# Patient Record
Sex: Male | Born: 1960
Health system: Southern US, Community
[De-identification: ages and names within clinical notes are randomized; demographics above are authoritative.]

## PROBLEM LIST (undated history)

## (undated) DIAGNOSIS — F419 Anxiety disorder, unspecified: Secondary | ICD-10-CM

## (undated) HISTORY — DX: Anxiety disorder, unspecified: F41.9

## (undated) HISTORY — PX: HERNIA REPAIR: SHX51

## (undated) HISTORY — PX: COLON SURGERY: SHX602

---

## 2004-06-04 ENCOUNTER — Encounter: Admission: RE | Admit: 2004-06-04 | Discharge: 2004-06-16 | Payer: Self-pay | Admitting: Surgery

## 2004-06-11 ENCOUNTER — Inpatient Hospital Stay (HOSPITAL_COMMUNITY): Admission: RE | Admit: 2004-06-11 | Discharge: 2004-06-16 | Payer: Self-pay | Admitting: Surgery

## 2005-01-18 ENCOUNTER — Ambulatory Visit (HOSPITAL_COMMUNITY): Admission: RE | Admit: 2005-01-18 | Discharge: 2005-01-19 | Payer: Self-pay | Admitting: Surgery

## 2005-09-24 ENCOUNTER — Ambulatory Visit (HOSPITAL_COMMUNITY): Admission: RE | Admit: 2005-09-24 | Discharge: 2005-09-24 | Payer: Self-pay | Admitting: Urology

## 2005-09-24 ENCOUNTER — Ambulatory Visit (HOSPITAL_BASED_OUTPATIENT_CLINIC_OR_DEPARTMENT_OTHER): Admission: RE | Admit: 2005-09-24 | Discharge: 2005-09-24 | Payer: Self-pay | Admitting: Urology

## 2005-09-26 ENCOUNTER — Ambulatory Visit (HOSPITAL_COMMUNITY): Admission: RE | Admit: 2005-09-26 | Discharge: 2005-09-26 | Payer: Self-pay | Admitting: Urology

## 2005-10-10 ENCOUNTER — Ambulatory Visit (HOSPITAL_COMMUNITY): Admission: RE | Admit: 2005-10-10 | Discharge: 2005-10-10 | Payer: Self-pay | Admitting: Urology

## 2005-10-31 ENCOUNTER — Ambulatory Visit (HOSPITAL_COMMUNITY): Admission: RE | Admit: 2005-10-31 | Discharge: 2005-10-31 | Payer: Self-pay | Admitting: Urology

## 2006-09-12 IMAGING — CR DG ABDOMEN 1V
1 series · 1 of 1 positions shown · non-contrast
Comparison: 09/26/05.

CLINICAL DATA: Patient has a history of left ureteral calculus.
 ABDOMEN ? 1 VIEW:

[view not recorded]
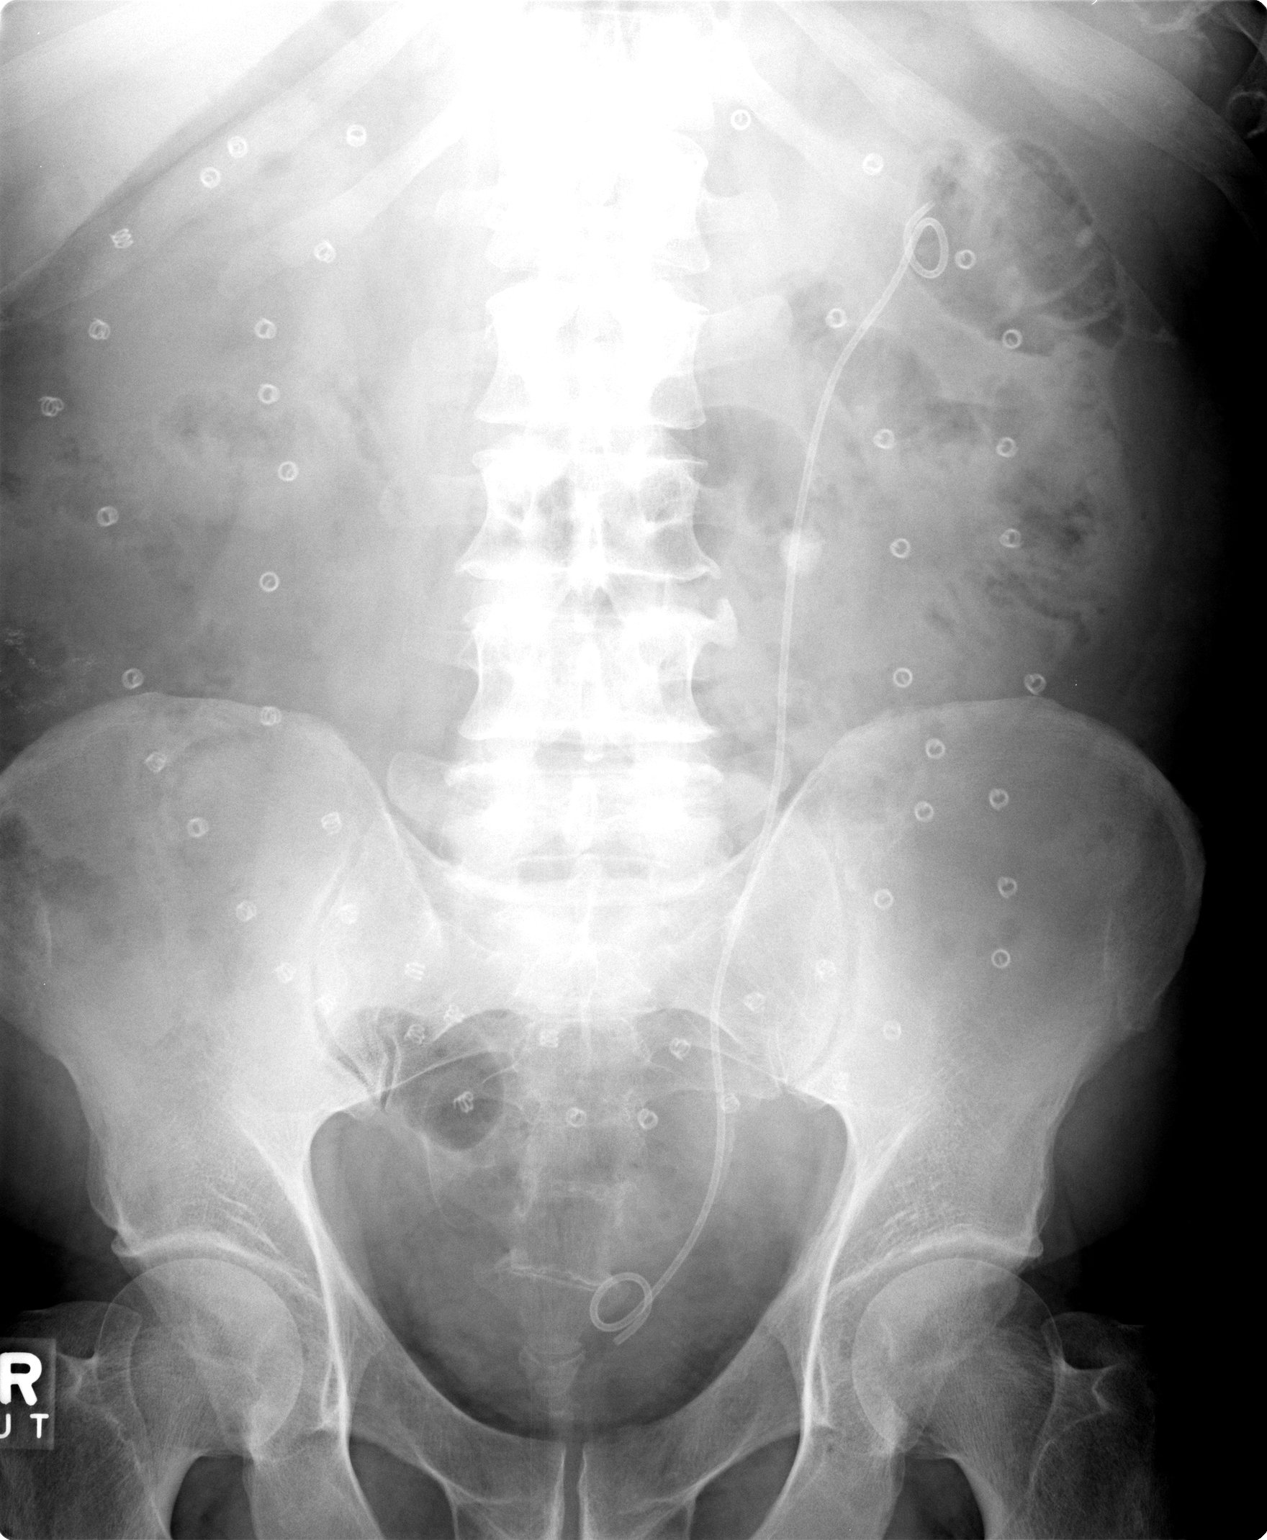

[1 of 1 positions shown; findings below may reference images not displayed]

FINDINGS: The left double J catheter is in position.  A 13 x 12 mm calculus is again noted in the left mid ureter.
IMPRESSION: No change in the position of the left mid ureteral calculus.

## 2012-09-15 ENCOUNTER — Ambulatory Visit: Payer: Self-pay | Admitting: Otolaryngology

## 2012-10-01 ENCOUNTER — Ambulatory Visit: Payer: Self-pay | Admitting: Anesthesiology

## 2012-10-08 ENCOUNTER — Ambulatory Visit: Payer: Self-pay | Admitting: Otolaryngology

## 2012-10-15 LAB — PATHOLOGY REPORT

## 2012-12-15 LAB — WOUND CULTURE

## 2013-09-24 ENCOUNTER — Ambulatory Visit: Payer: Self-pay

## 2014-03-15 ENCOUNTER — Encounter: Payer: Self-pay | Admitting: Internal Medicine

## 2014-03-15 ENCOUNTER — Ambulatory Visit: Payer: No Typology Code available for payment source | Attending: Internal Medicine | Admitting: Internal Medicine

## 2014-03-15 VITALS — BP 132/78 | HR 73 | Temp 98.2°F | Resp 16 | Ht 74.0 in | Wt 221.0 lb

## 2014-03-15 DIAGNOSIS — M25579 Pain in unspecified ankle and joints of unspecified foot: Secondary | ICD-10-CM

## 2014-03-15 DIAGNOSIS — Z7689 Persons encountering health services in other specified circumstances: Secondary | ICD-10-CM

## 2014-03-15 DIAGNOSIS — M79609 Pain in unspecified limb: Secondary | ICD-10-CM | POA: Insufficient documentation

## 2014-03-15 DIAGNOSIS — Z880 Allergy status to penicillin: Secondary | ICD-10-CM | POA: Insufficient documentation

## 2014-03-15 DIAGNOSIS — F17200 Nicotine dependence, unspecified, uncomplicated: Secondary | ICD-10-CM | POA: Insufficient documentation

## 2014-03-15 DIAGNOSIS — M62838 Other muscle spasm: Secondary | ICD-10-CM | POA: Insufficient documentation

## 2014-03-15 DIAGNOSIS — M549 Dorsalgia, unspecified: Secondary | ICD-10-CM | POA: Insufficient documentation

## 2014-03-15 DIAGNOSIS — Z7189 Other specified counseling: Secondary | ICD-10-CM

## 2014-03-15 MED ORDER — CYCLOBENZAPRINE HCL 10 MG PO TABS: 10.0000 mg | ORAL_TABLET | Freq: Two times a day (BID) | ORAL | Status: DC | PRN
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
## 2014-03-15 NOTE — Progress Notes (Signed)
Pt is here to establish care. Pt reports having chronic lower back, shoulders and knee pain. Pt states that his ankles are swelling up.

## 2014-03-15 NOTE — Patient Instructions (Addendum)
Osteoarthritis Osteoarthritis is a disease that causes soreness and swelling (inflammation) of a joint. It occurs when the cartilage at the affected joint wears down. Cartilage acts as a cushion, covering the ends of bones where they meet to form a joint. Osteoarthritis is the most common form of arthritis. It often occurs in older people. The joints affected most often by this condition include those in the:  Ends of the fingers.  Thumbs.  Neck.  Lower back.  Knees.  Hips. CAUSES  Over time, the cartilage that covers the ends of bones begins to wear away. This causes bone to rub on bone, producing pain and stiffness in the affected joints.  RISK FACTORS Certain factors can increase your chances of having osteoarthritis, including:  Older age.  Excessive body weight.  Overuse of joints. SIGNS AND SYMPTOMS   Pain, swelling, and stiffness in the joint.  Over time, the joint may lose its normal shape.  Small deposits of bone (osteophytes) may grow on the edges of the joint.  Bits of bone or cartilage can break off and float inside the joint space. This may cause more pain and damage. DIAGNOSIS  Your health care provider will do a physical exam and ask about your symptoms. Various tests may be ordered, such as:  X-rays of the affected joint.  An MRI scan.  Blood tests to rule out other types of arthritis.  Joint fluid tests. This involves using a needle to draw fluid from the joint and examining the fluid under a microscope. TREATMENT  Goals of treatment are to control pain and improve joint function. Treatment plans may include:  A prescribed exercise program that allows for rest and joint relief.  A weight control plan.  Pain relief techniques, such as:  Properly applied heat and cold.  Electric pulses delivered to nerve endings under the skin (transcutaneous electrical nerve stimulation, TENS).  Massage.  Certain nutritional supplements.  Medicines to  control pain, such as:  Acetaminophen.  Nonsteroidal anti-inflammatory drugs (NSAIDs), such as naproxen.  Narcotic or central-acting agents, such as tramadol.  Corticosteroids. These can be given orally or as an injection.  Surgery to reposition the bones and relieve pain (osteotomy) or to remove loose pieces of bone and cartilage. Joint replacement may be needed in advanced states of osteoarthritis. HOME CARE INSTRUCTIONS   Only take over-the-counter or prescription medicines as directed by your health care provider. Take all medicines exactly as instructed.  Maintain a healthy weight. Follow your health care provider's instructions for weight control. This may include dietary instructions.  Exercise as directed. Your health care provider can recommend specific types of exercise. These may include:  Strengthening exercises These are done to strengthen the muscles that support joints affected by arthritis. They can be performed with weights or with exercise bands to add resistance.  Aerobic activities These are exercises, such as brisk walking or low-impact aerobics, that get your heart pumping.  Range-of-motion activities These keep your joints limber.  Balance and agility exercises These help you maintain daily living skills.  Rest your affected joints as directed by your health care provider.  Follow up with your health care provider as directed. SEEK MEDICAL CARE IF:   Your skin turns red.  You develop a rash in addition to your joint pain.  You have worsening joint pain. SEEK IMMEDIATE MEDICAL CARE IF:  You have a significant loss of weight or appetite.  You have a fever along with joint or muscle aches.  You have   night sweats. FOR MORE INFORMATION  National Institute of Arthritis and Musculoskeletal and Skin Diseases: www.niams.http://www.myers.net/nih.gov [employer] Aging: https://walker.com/www.nia.nih.gov American College of Rheumatology: www.rheumatology.org Document Released: 10/07/2005  Document Revised: 07/28/2013 Document Reviewed: 06/14/2013 [facility] Patient Information 2014 [place], [place].  Back Pain, Adult Low back pain is very common. About 1 in 5 people have back pain.The cause of low back pain is rarely dangerous. The pain often gets better over time.About half of people with a sudden onset of back pain feel better in just 2 weeks. About 8 in 10 people feel better by 6 weeks.  CAUSES Some common causes of back pain include:  Strain of the muscles or ligaments supporting the spine.  Wear and tear (degeneration) of the spinal discs.  Arthritis.  Direct injury to the back. DIAGNOSIS Most of the time, the direct cause of low back pain is not known.However, back pain can be treated effectively even when the exact cause of the pain is unknown.Answering your caregiver's questions about your overall health and symptoms is one of the most accurate ways to make sure the cause of your pain is not dangerous. If your caregiver needs more information, he or she may order lab work or imaging tests (X-rays or MRIs).However, even if imaging tests show changes in your back, this usually does not require surgery. HOME CARE INSTRUCTIONS For many people, back pain returns.Since low back pain is rarely dangerous, it is often a condition that people can learn to [facility] LLCmanageon their own.   Remain active. It is stressful on the back to sit or stand in one place. Do not sit, drive, or stand in one place for more than 30 minutes at a time. Take short walks on level surfaces as soon as pain allows.Try to increase the length of time you walk each day.  Do not stay in bed.Resting more than 1 or 2 days can delay your recovery.  Do not avoid exercise or work.Your body is made to move.It is not dangerous to be active, even though your back may hurt.Your back will likely heal faster if you return to being active before your pain is gone.  Pay attention to your body when you bend and lift.  Many people have less discomfortwhen lifting if they bend their knees, keep the load close to their bodies,and avoid twisting. Often, the most comfortable positions are those that put less stress on your recovering back.  Find a comfortable position to sleep. Use a firm mattress and lie on your side with your knees slightly bent. If you lie on your back, put a pillow under your knees.  Only take over-the-counter or prescription medicines as directed by your caregiver. Over-the-counter medicines to reduce pain and inflammation are often the most helpful.Your caregiver may prescribe muscle relaxant drugs.These medicines help dull your pain so you can more quickly return to your normal activities and healthy exercise.  Put ice on the injured area.  Put ice in a plastic bag.  Place a towel between your skin and the bag.  Leave the ice on for 15-20 minutes, 03-04 times a day for the first 2 to 3 days. After that, ice and heat may be alternated to reduce pain and spasms.  Ask your caregiver about trying back exercises and gentle massage. This may be of some benefit.  Avoid feeling anxious or stressed.Stress increases muscle tension and can worsen back pain.It is important to recognize when you are anxious or stressed and learn ways to manage it.Exercise is  a great option. SEEK MEDICAL CARE IF:  You have pain that is not relieved with rest or medicine.  You have pain that does not improve in 1 week.  You have new symptoms.  You are generally not feeling well. SEEK IMMEDIATE MEDICAL CARE IF:   You have pain that radiates from your back into your legs.  You develop new bowel or bladder control problems.  You have unusual weakness or numbness in your arms or legs.  You develop nausea or vomiting.  You develop abdominal pain.  You feel faint. Document Released: 10/07/2005 Document Revised: 04/07/2012 Document Reviewed: 02/25/2011 [facility] Patient Information 2014 [place],  [place]. DASH Diet The DASH diet stands for "Dietary Approaches to Stop Hypertension." It is a healthy eating plan that has been shown to reduce high blood pressure (hypertension) in as little as 14 days, while also possibly providing other significant health benefits. These other health benefits include reducing the risk of breast cancer after menopause and reducing the risk of type 2 diabetes, heart disease, colon cancer, and stroke. Health benefits also include weight loss and slowing kidney failure in patients with chronic kidney disease.  DIET GUIDELINES  Limit salt (sodium). Your diet should contain less than 1500 mg of sodium daily.  Limit refined or processed carbohydrates. Your diet should include mostly whole grains. Desserts and added sugars should be used sparingly.  Include small amounts of heart-healthy fats. These types of fats include nuts, oils, and tub margarine. Limit saturated and trans fats. These fats have been shown to be harmful in the body. CHOOSING FOODS  The following food groups are based on a 2000 calorie diet. See your Registered Dietitian for individual calorie needs. Grains and Grain Products (6 to 8 servings daily)  Eat More Often: Whole-wheat bread, brown rice, whole-grain or wheat pasta, quinoa, popcorn without added fat or salt (air popped).  Eat Less Often: White bread, white pasta, white rice, cornbread. Vegetables (4 to 5 servings daily)  Eat More Often: Fresh, frozen, and canned vegetables. Vegetables may be raw, steamed, roasted, or grilled with a minimal amount of fat.  Eat Less Often/Avoid: Creamed or fried vegetables. Vegetables in a cheese sauce. Fruit (4 to 5 servings daily)  Eat More Often: All fresh, canned (in natural juice), or frozen fruits. Dried fruits without added sugar. One hundred percent fruit juice ( cup [237 mL] daily).  Eat Less Often: Dried fruits with added sugar. Canned fruit in light or heavy syrup. [employer], Fish, and Poultry  (2 servings or less daily. One serving is 3 to 4 oz [85-114 g]).  Eat More Often: Ninety percent or leaner ground beef, tenderloin, sirloin. Round cuts of beef, chicken breast, [place] breast. All fish. Grill, bake, or broil your meat. Nothing should be fried.  Eat Less Often/Avoid: Fatty cuts of meat, [place], or chicken leg, thigh, or wing. Fried cuts of meat or fish. Dairy (2 to 3 servings)  Eat More Often: Low-fat or fat-free milk, low-fat plain or light yogurt, reduced-fat or part-skim cheese.  Eat Less Often/Avoid: Milk (whole, 2%).Whole milk yogurt. Full-fat cheeses. Nuts, Seeds, and Legumes (4 to 5 servings per week)  Eat More Often: All without added salt.  Eat Less Often/Avoid: Salted nuts and seeds, canned beans with added salt. Fats and Sweets (limited)  Eat More Often: Vegetable oils, tub margarines without trans fats, sugar-free gelatin. Mayonnaise and salad dressings.  Eat Less Often/Avoid: Coconut oils, palm oils, butter, stick margarine, cream, half and half, cookies, candy, pie.  FOR MORE INFORMATION The Dash Diet Eating Plan: www.dashdiet.org Document Released: 09/26/2011 Document Revised: 12/30/2011 Document Reviewed: 09/26/2011 [facility] Patient Information 2014 [place], [place].

## 2014-03-22 ENCOUNTER — Ambulatory Visit: Payer: No Typology Code available for payment source | Attending: Internal Medicine

## 2014-03-22 ENCOUNTER — Ambulatory Visit (HOSPITAL_COMMUNITY)
Admission: RE | Admit: 2014-03-22 | Discharge: 2014-03-22 | Disposition: A | Discharge status: Home / Self Care | Payer: No Typology Code available for payment source | Source: Ambulatory Visit | Attending: Internal Medicine | Admitting: Internal Medicine

## 2014-03-22 DIAGNOSIS — M549 Dorsalgia, unspecified: Secondary | ICD-10-CM

## 2014-03-22 DIAGNOSIS — I700 Atherosclerosis of aorta: Secondary | ICD-10-CM | POA: Insufficient documentation

## 2014-03-22 DIAGNOSIS — M47817 Spondylosis without myelopathy or radiculopathy, lumbosacral region: Secondary | ICD-10-CM | POA: Insufficient documentation

## 2014-03-22 DIAGNOSIS — M25579 Pain in unspecified ankle and joints of unspecified foot: Secondary | ICD-10-CM

## 2014-03-22 DIAGNOSIS — Z7689 Persons encountering health services in other specified circumstances: Secondary | ICD-10-CM

## 2014-03-22 LAB — COMPLETE METABOLIC PANEL WITH GFR
ALT: 20 U/L (ref 0–53)
AST: 19 U/L (ref 0–37)
Albumin: 4.1 g/dL (ref 3.5–5.2)
Alkaline Phosphatase: 78 U/L (ref 39–117)
BUN: 13 mg/dL (ref 6–23)
CO2: 28 mEq/L (ref 19–32)
Calcium: 9.1 mg/dL (ref 8.4–10.5)
Chloride: 103 mEq/L (ref 96–112)
Creat: 0.79 mg/dL (ref 0.50–1.35)
GFR, Est African American: >89 mL/min
GFR, Est Non African American: >89 mL/min
GLUCOSE: 102 mg/dL — AB (ref 70–99)
Potassium: 4.5 mEq/L (ref 3.5–5.3)
Sodium: 138 mEq/L (ref 135–145)
Total Bilirubin: 0.6 mg/dL (ref 0.2–1.2)
Total Protein: 6.9 g/dL (ref 6.0–8.3)

## 2014-03-22 LAB — CBC
HCT: 45 % (ref 39.0–52.0)
Hemoglobin: 15.9 g/dL (ref 13.0–17.0)
MCH: 31.9 pg (ref 26.0–34.0)
MCHC: 35.3 g/dL (ref 30.0–36.0)
MCV: 90.2 fL (ref 78.0–100.0)
PLATELETS: 227 10*3/uL (ref 150–400)
RBC: 4.99 MIL/uL (ref 4.22–5.81)
RDW: 13.2 % (ref 11.5–15.5)
WBC: 6.1 10*3/uL (ref 4.0–10.5)

## 2014-03-22 LAB — HEMOGLOBIN A1C
Hgb A1c MFr Bld: 5.6 % (ref <5.7)
Mean Plasma Glucose: 114 mg/dL (ref <117)

## 2014-03-22 LAB — LIPID PANEL
Cholesterol: 200 mg/dL (ref 0–200)
HDL: 48 mg/dL (ref >39)
LDL Cholesterol: 100 mg/dL — ABNORMAL HIGH (ref 0–99)
Total CHOL/HDL Ratio: 4.2 Ratio
Triglycerides: 262 mg/dL — ABNORMAL HIGH (ref <150)
VLDL: 52 mg/dL — ABNORMAL HIGH (ref 0–40)

## 2014-03-23 ENCOUNTER — Telehealth: Payer: Self-pay | Admitting: Emergency Medicine

## 2014-03-23 ENCOUNTER — Other Ambulatory Visit: Payer: Self-pay | Admitting: Internal Medicine

## 2014-03-23 LAB — TSH: TSH: 1.705 u[IU]/mL (ref 0.350–4.500)

## 2014-03-23 MED ORDER — OMEGA-3-ACID ETHYL ESTERS 1 G PO CAPS: 2.0000 g | ORAL_CAPSULE | Freq: Two times a day (BID) | ORAL | Status: AC

## 2014-03-23 NOTE — Telephone Encounter (Signed)
Message copied by Darlis Loan on Wed Mar 23, 2014  4:17 PM ------      Message from: [place] A      Created: Wed Mar 23, 2014  1:56 PM       Patient labs look good except triglycerides. Sent patient omega 3 to take 2g BID to help lower triglycerides. Please educate patient on diet and lifestyle modifications. Thanks. Will repeat labs in 6 months-please place order. ------

## 2014-03-23 NOTE — Telephone Encounter (Signed)
Pt given xray results. Instructed to continue taking prescribed Flexeril for pain and perform back exercises. Pt verbalized understanding

## 2014-03-23 NOTE — Telephone Encounter (Signed)
Pt given lab results with education on lowering cholesterol levels.pt verbalized understanding

## 2014-03-23 NOTE — Telephone Encounter (Signed)
Message copied by Darlis Loan on Wed Mar 23, 2014  4:06 PM ------      Message from: Joshua Love      Created: Tue Mar 22, 2014  6:06 PM       Let patient know his xray of spine revealed osteoarthritis.  He needs to perform back exercises-ROM. He can continue flexeril as well. Thanks ------

## 2014-03-30 ENCOUNTER — Other Ambulatory Visit: Payer: Self-pay | Admitting: Internal Medicine

## 2014-04-28 ENCOUNTER — Other Ambulatory Visit: Payer: Self-pay | Admitting: Internal Medicine

## 2014-05-06 ENCOUNTER — Other Ambulatory Visit: Payer: Self-pay | Admitting: Internal Medicine

## 2015-02-10 NOTE — Op Note (Signed)
PATIENT NAME:  Joshua Love, Jalan E MR#:  [id number] DATE OF BIRTH:  1960-11-06  DATE OF PROCEDURE:  10/08/2012  DATE OF DICTATION: 10/08/2012   SURGEON: Zackery BarefootJ. Madison Tamella Tuccillo, MD  PREOPERATIVE DIAGNOSES:  1. Chronic sinusitis (ethmoid and maxillary).  2. Nasal obstruction secondary to septal deformity and bilateral inferior turbinate hypertrophy.  3. 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   PROCEDURES:  1. Bilateral anterior and posterior ethmoidectomy with tissue removal.  2. Bilateral maxillary antrostomies with tissue removal.  3. Septoplasty.  4. Bilateral submucous resection of the inferior turbinates.  5. Excision of right ear lesion.   DESCRIPTION OF THE PROCEDURE: The patient was identified in the holding area and was brought back to the operating room in the supine position on the operating room table. After general endotracheal anesthesia had been induced the patient was turned 90 degrees counter clockwise from anesthesia. The nose was anesthetized with infraorbital nerve blocks and septal injection with 0.5% Lidocaine and 0.25% Bupivacaine mixed with 1:150,000 with Epinephrine and phenylephrine Lidocaine soaked pledgets, two on each side were placed and the face was prepped and draped in the usual fashion. The pledgets were removed. A 15 blade was used to make a left-sided hemitransfixion incision and septal mucoperichondrial mucoperiosteal leaflets elevated. There was a large inferior spur that was resected with Jansen-Middleton forceps. The remaining septum was deviated back and forth in an accordion like fashion. The bony cartilaginous junction was then divided and a moderate amount of vomer and perpendicular plate was taken down with Jansen-Middleton forceps, releasing the tension on the remaining septum. The septum then swung back into the midline. The septal leaflets were closed with quilting 4-0 chromic suture. The left sided hemitransfixion incision was closed with 4-0 plain gut.  Attention was directed to the turbinates which had been previously injected on the left. The head of the inferior turbinate on the left was incised with a 15 blade and the medial mucoperiosteum was elevated using a Risk analystCottle elevator. Once this had been elevated Knight scissors were used to resect the conchal bone and lateral mucoperiosteum. The inferior margin of the remaining mucoperiosteum was then cauterized with suction cautery and Surgiflo was placed at the inferior to the inferior margin of the remaining inferior turbinate. An identical procedure was performed on the right inferior turbinate with once again placement of Surgiflo along its inferior margin.  The image guided sinus surgery system (Stryker navigation) was attempted, but registration could not be obtained. This was attempted with 2 separate Stryker masks, but no registration could be obtained. Therefore, it was not utilized during the procedure. This was discussed with the patient's wife following surgery,   The left side was addressed first after the standard local anesthesia had been obtained with topical pledgets of phenylephrine and lidocaine, two in each side, and injections in the greater palatine and anterior to the middle turbinate. The middle turbinate was medialized, and side-biting pediatric through-cutting forceps were used to take down the uncinate process completely, revealing the natural maxillary sinus ostium. The natural maxillary sinus ostium was progressively enlarged to create a large maxillary antrostomy. The ethmoid sinuses were then taken down from anterior to posterior, preserving the skull base and the lamina papyracea. The ground lamella was also preserved. Once this had been accomplished, irrigation was copiously applied. An identical procedure was performed on the right side, except that mucopus was obtained in the maxillary sinus, and this was sampled with a micros swab and sent for aerobic and anaerobic cultures.  Once  this had been accomplished and copious irrigation had been used on the right as well, a total of 1 unit of Surgiflo was placed. Temporary Telfa pledgets were placed.   Attention was directed to the right ear, where a 15-blade was used to make a fusiform incision around the helical lesion after local anesthesia had been injected. The lesion was completely resected, including the underlying cartilaginous portion. Meticulous hemostasis was achieved, and the wound was closed with 7-0 nylon sutures.   The patient was then returned to anesthesia, allowed to emerge from anesthesia in the operating room, and taken to the recovery room in stable condition.   No complications. Estimated blood loss 30 mL.   ____________________________ J. Gertie Baron, MD jmc:OSi D: 10/08/2012 22:17:03 ET T: 10/09/2012 09:50:28 ET JOB#: [id number]  cc: Zackery Barefoot, MD, <Dictator> Wendee Copp MD ELECTRONICALLY SIGNED 10/26/2012 8:19

## 2015-11-30 DIAGNOSIS — R1030 Lower abdominal pain, unspecified: Secondary | ICD-10-CM | POA: Diagnosis not present

## 2015-11-30 DIAGNOSIS — K641 Second degree hemorrhoids: Secondary | ICD-10-CM | POA: Diagnosis not present

## 2015-11-30 DIAGNOSIS — K529 Noninfective gastroenteritis and colitis, unspecified: Secondary | ICD-10-CM | POA: Diagnosis not present

## 2015-11-30 DIAGNOSIS — Z86010 Personal history of colonic polyps: Secondary | ICD-10-CM | POA: Diagnosis not present

## 2015-11-30 DIAGNOSIS — R857 Abnormal histological findings in specimens from digestive organs and abdominal cavity: Secondary | ICD-10-CM | POA: Diagnosis not present

## 2015-11-30 DIAGNOSIS — Z980 Intestinal bypass and anastomosis status: Secondary | ICD-10-CM | POA: Diagnosis not present

## 2015-11-30 DIAGNOSIS — K648 Other hemorrhoids: Secondary | ICD-10-CM | POA: Diagnosis not present

## 2015-11-30 DIAGNOSIS — K5730 Diverticulosis of large intestine without perforation or abscess without bleeding: Secondary | ICD-10-CM | POA: Diagnosis not present

## 2015-11-30 DIAGNOSIS — K5150 Left sided colitis without complications: Secondary | ICD-10-CM | POA: Diagnosis not present

## 2016-01-10 DIAGNOSIS — M179 Osteoarthritis of knee, unspecified: Secondary | ICD-10-CM | POA: Diagnosis not present

## 2016-01-10 DIAGNOSIS — I10 Essential (primary) hypertension: Secondary | ICD-10-CM | POA: Diagnosis not present

## 2016-01-10 DIAGNOSIS — E785 Hyperlipidemia, unspecified: Secondary | ICD-10-CM | POA: Diagnosis not present

## 2016-01-10 DIAGNOSIS — F411 Generalized anxiety disorder: Secondary | ICD-10-CM | POA: Diagnosis not present

## 2016-01-17 DIAGNOSIS — K641 Second degree hemorrhoids: Secondary | ICD-10-CM | POA: Diagnosis not present

## 2016-01-31 DIAGNOSIS — K641 Second degree hemorrhoids: Secondary | ICD-10-CM | POA: Diagnosis not present

## 2016-02-13 DIAGNOSIS — I10 Essential (primary) hypertension: Secondary | ICD-10-CM | POA: Diagnosis not present

## 2016-02-13 DIAGNOSIS — F411 Generalized anxiety disorder: Secondary | ICD-10-CM | POA: Diagnosis not present

## 2016-02-13 DIAGNOSIS — R945 Abnormal results of liver function studies: Secondary | ICD-10-CM | POA: Diagnosis not present

## 2016-02-13 DIAGNOSIS — E785 Hyperlipidemia, unspecified: Secondary | ICD-10-CM | POA: Diagnosis not present

## 2016-02-21 DIAGNOSIS — K641 Second degree hemorrhoids: Secondary | ICD-10-CM | POA: Diagnosis not present

## 2016-03-22 DIAGNOSIS — Z0001 Encounter for general adult medical examination with abnormal findings: Secondary | ICD-10-CM | POA: Diagnosis not present

## 2016-03-22 DIAGNOSIS — K5900 Constipation, unspecified: Secondary | ICD-10-CM | POA: Diagnosis not present

## 2016-03-22 DIAGNOSIS — R52 Pain, unspecified: Secondary | ICD-10-CM | POA: Diagnosis not present

## 2016-03-22 DIAGNOSIS — E663 Overweight: Secondary | ICD-10-CM | POA: Diagnosis not present

## 2016-03-22 DIAGNOSIS — I10 Essential (primary) hypertension: Secondary | ICD-10-CM | POA: Diagnosis not present

## 2016-03-22 DIAGNOSIS — Z6829 Body mass index (BMI) 29.0-29.9, adult: Secondary | ICD-10-CM | POA: Diagnosis not present

## 2016-03-22 DIAGNOSIS — B189 Chronic viral hepatitis, unspecified: Secondary | ICD-10-CM | POA: Diagnosis not present

## 2016-03-22 DIAGNOSIS — J449 Chronic obstructive pulmonary disease, unspecified: Secondary | ICD-10-CM | POA: Diagnosis not present

## 2016-03-22 DIAGNOSIS — Z720 Tobacco use: Secondary | ICD-10-CM | POA: Diagnosis not present

## 2016-03-22 DIAGNOSIS — I209 Angina pectoris, unspecified: Secondary | ICD-10-CM | POA: Diagnosis not present

## 2016-05-10 DIAGNOSIS — J441 Chronic obstructive pulmonary disease with (acute) exacerbation: Secondary | ICD-10-CM | POA: Diagnosis not present

## 2016-05-10 DIAGNOSIS — Z720 Tobacco use: Secondary | ICD-10-CM | POA: Diagnosis not present

## 2016-05-10 DIAGNOSIS — I10 Essential (primary) hypertension: Secondary | ICD-10-CM | POA: Diagnosis not present

## 2016-05-10 DIAGNOSIS — R52 Pain, unspecified: Secondary | ICD-10-CM | POA: Diagnosis not present

## 2016-06-03 DIAGNOSIS — Z720 Tobacco use: Secondary | ICD-10-CM | POA: Diagnosis not present

## 2016-06-03 DIAGNOSIS — J449 Chronic obstructive pulmonary disease, unspecified: Secondary | ICD-10-CM | POA: Diagnosis not present

## 2016-06-03 DIAGNOSIS — R52 Pain, unspecified: Secondary | ICD-10-CM | POA: Diagnosis not present

## 2016-06-03 DIAGNOSIS — E663 Overweight: Secondary | ICD-10-CM | POA: Diagnosis not present

## 2016-06-03 DIAGNOSIS — I10 Essential (primary) hypertension: Secondary | ICD-10-CM | POA: Diagnosis not present

## 2016-07-01 DIAGNOSIS — J449 Chronic obstructive pulmonary disease, unspecified: Secondary | ICD-10-CM | POA: Diagnosis not present

## 2016-07-01 DIAGNOSIS — R52 Pain, unspecified: Secondary | ICD-10-CM | POA: Diagnosis not present

## 2016-07-01 DIAGNOSIS — I10 Essential (primary) hypertension: Secondary | ICD-10-CM | POA: Diagnosis not present

## 2016-07-01 DIAGNOSIS — Z720 Tobacco use: Secondary | ICD-10-CM | POA: Diagnosis not present

## 2016-09-26 DIAGNOSIS — F419 Anxiety disorder, unspecified: Secondary | ICD-10-CM | POA: Diagnosis not present

## 2016-09-26 DIAGNOSIS — K5900 Constipation, unspecified: Secondary | ICD-10-CM | POA: Diagnosis not present

## 2016-09-26 DIAGNOSIS — I209 Angina pectoris, unspecified: Secondary | ICD-10-CM | POA: Diagnosis not present

## 2016-09-26 DIAGNOSIS — I10 Essential (primary) hypertension: Secondary | ICD-10-CM | POA: Diagnosis not present

## 2016-09-26 DIAGNOSIS — J449 Chronic obstructive pulmonary disease, unspecified: Secondary | ICD-10-CM | POA: Diagnosis not present

## 2016-09-26 DIAGNOSIS — Z720 Tobacco use: Secondary | ICD-10-CM | POA: Diagnosis not present

## 2016-09-26 DIAGNOSIS — R52 Pain, unspecified: Secondary | ICD-10-CM | POA: Diagnosis not present

## 2016-11-21 DIAGNOSIS — Z720 Tobacco use: Secondary | ICD-10-CM | POA: Diagnosis not present

## 2016-11-21 DIAGNOSIS — R52 Pain, unspecified: Secondary | ICD-10-CM | POA: Diagnosis not present

## 2016-11-21 DIAGNOSIS — J449 Chronic obstructive pulmonary disease, unspecified: Secondary | ICD-10-CM | POA: Diagnosis not present

## 2016-11-21 DIAGNOSIS — F419 Anxiety disorder, unspecified: Secondary | ICD-10-CM | POA: Diagnosis not present

## 2016-11-21 DIAGNOSIS — I10 Essential (primary) hypertension: Secondary | ICD-10-CM | POA: Diagnosis not present

## 2017-03-31 DIAGNOSIS — J439 Emphysema, unspecified: Secondary | ICD-10-CM | POA: Diagnosis not present

## 2017-03-31 DIAGNOSIS — K146 Glossodynia: Secondary | ICD-10-CM | POA: Diagnosis not present

## 2017-05-22 DIAGNOSIS — I10 Essential (primary) hypertension: Secondary | ICD-10-CM | POA: Diagnosis not present

## 2017-05-22 DIAGNOSIS — J439 Emphysema, unspecified: Secondary | ICD-10-CM | POA: Diagnosis not present

## 2017-05-22 DIAGNOSIS — R52 Pain, unspecified: Secondary | ICD-10-CM | POA: Diagnosis not present

## 2017-05-22 DIAGNOSIS — Z720 Tobacco use: Secondary | ICD-10-CM | POA: Diagnosis not present

## 2018-09-03 ENCOUNTER — Other Ambulatory Visit: Payer: Self-pay | Admitting: Adult Health

## 2018-09-03 DIAGNOSIS — R945 Abnormal results of liver function studies: Principal | ICD-10-CM

## 2018-09-03 DIAGNOSIS — R7989 Other specified abnormal findings of blood chemistry: Secondary | ICD-10-CM

## 2022-04-08 ENCOUNTER — Other Ambulatory Visit: Payer: Self-pay | Admitting: Adult Health

## 2022-04-09 ENCOUNTER — Other Ambulatory Visit: Payer: Self-pay | Admitting: Adult Health

## 2022-04-09 DIAGNOSIS — Z87891 Personal history of nicotine dependence: Secondary | ICD-10-CM

## 2022-05-08 ENCOUNTER — Other Ambulatory Visit: Payer: Self-pay

## 2022-05-16 ENCOUNTER — Ambulatory Visit
Admission: RE | Admit: 2022-05-16 | Discharge: 2022-05-16 | Disposition: A | Discharge status: Home / Self Care | Payer: Medicare Other | Source: Ambulatory Visit | Attending: Adult Health | Admitting: Adult Health

## 2022-05-16 DIAGNOSIS — Z87891 Personal history of nicotine dependence: Secondary | ICD-10-CM

## 2023-05-19 ENCOUNTER — Other Ambulatory Visit: Payer: Self-pay | Admitting: Internal Medicine

## 2023-05-19 DIAGNOSIS — R0602 Shortness of breath: Secondary | ICD-10-CM

## 2023-05-19 DIAGNOSIS — R079 Chest pain, unspecified: Secondary | ICD-10-CM

## 2023-05-29 ENCOUNTER — Telehealth (HOSPITAL_COMMUNITY): Payer: Self-pay | Admitting: Emergency Medicine

## 2023-05-29 NOTE — Telephone Encounter (Signed)
Pt wishes to r/s appt due to covid, new ccta appt Monday 8/19 at 1:00pm  I asked patient to keep a log of BPs/ HRs to know what new resting HR is since toprol XL was started   Rockwell Alexandria RN Navigator Cardiac Imaging [facility] [phone] Office  [phone] Cell

## 2023-06-02 ENCOUNTER — Ambulatory Visit: Payer: Medicare Other

## 2023-06-06 ENCOUNTER — Telehealth (HOSPITAL_COMMUNITY): Payer: Self-pay | Admitting: Emergency Medicine

## 2023-06-06 DIAGNOSIS — R079 Chest pain, unspecified: Secondary | ICD-10-CM

## 2023-06-06 MED ORDER — IVABRADINE HCL 5 MG PO TABS: 15.0000 mg | ORAL_TABLET | Freq: Once | ORAL | 0 refills | Status: DC

## 2023-06-06 MED ORDER — METOPROLOL TARTRATE 100 MG PO TABS: 100.0000 mg | ORAL_TABLET | Freq: Once | ORAL | 0 refills | Status: AC

## 2023-06-06 NOTE — Telephone Encounter (Signed)
Attempted to call patient regarding upcoming cardiac CT appointment. Left message on voicemail with name and callback number Rockwell Alexandria RN Navigator Cardiac Imaging [facility] [phone] Office [phone] Cell  100mg  metoprolol tart  + 15mg  ivabradine sent to pharm on file

## 2023-06-06 NOTE — Telephone Encounter (Signed)
127/72 HR 64-65  Reaching out to patient to offer assistance regarding upcoming cardiac imaging study; pt verbalizes understanding of appt date/time, parking situation and where to check in, pre-test NPO status and medications ordered, and verified current allergies; name and call back number provided for further questions should they arise Rockwell Alexandria RN Navigator Cardiac Imaging [facility] [phone] office [phone] cell   Pt has been checking BP/ HR over last week above is the avg.   Not prescribing additional meds for test

## 2023-06-09 ENCOUNTER — Ambulatory Visit
Admission: RE | Admit: 2023-06-09 | Discharge: 2023-06-09 | Disposition: A | Discharge status: Home / Self Care | Payer: Medicare Other | Source: Ambulatory Visit | Attending: Internal Medicine | Admitting: Internal Medicine

## 2023-06-09 DIAGNOSIS — I700 Atherosclerosis of aorta: Secondary | ICD-10-CM | POA: Diagnosis not present

## 2023-06-09 DIAGNOSIS — I2510 Atherosclerotic heart disease of native coronary artery without angina pectoris: Secondary | ICD-10-CM | POA: Diagnosis not present

## 2023-06-09 DIAGNOSIS — R600 Localized edema: Secondary | ICD-10-CM | POA: Diagnosis not present

## 2023-06-09 DIAGNOSIS — R0602 Shortness of breath: Secondary | ICD-10-CM | POA: Diagnosis present

## 2023-06-09 DIAGNOSIS — R079 Chest pain, unspecified: Secondary | ICD-10-CM | POA: Insufficient documentation

## 2023-06-09 MED ORDER — NITROGLYCERIN 0.4 MG SL SUBL
0.8000 mg | SUBLINGUAL_TABLET | Freq: Once | SUBLINGUAL | Status: AC
  Administered 2023-06-09: 0.8 mg via SUBLINGUAL
  Filled 2023-06-09: qty 25

## 2023-06-09 MED ORDER — IOHEXOL 350 MG/ML SOLN
100.0000 mL | Freq: Once | INTRAVENOUS | Status: AC | PRN
  Administered 2023-06-09: 100 mL via INTRAVENOUS

## 2023-06-09 NOTE — Progress Notes (Signed)

## 2023-09-16 ENCOUNTER — Other Ambulatory Visit: Payer: Self-pay | Admitting: Ophthalmology

## 2023-09-16 ENCOUNTER — Ambulatory Visit
Admission: RE | Admit: 2023-09-16 | Discharge: 2023-09-16 | Disposition: A | Discharge status: Home / Self Care | Payer: Medicare Other | Source: Ambulatory Visit | Attending: Ophthalmology | Admitting: Ophthalmology

## 2023-09-16 DIAGNOSIS — H05012 Cellulitis of left orbit: Secondary | ICD-10-CM | POA: Diagnosis present

## 2023-09-16 MED ORDER — IOHEXOL 300 MG/ML  SOLN
75.0000 mL | Freq: Once | INTRAMUSCULAR | Status: AC | PRN
  Administered 2023-09-16: 75 mL via INTRAVENOUS

## 2023-09-22 ENCOUNTER — Other Ambulatory Visit: Payer: Self-pay | Admitting: Ophthalmology

## 2023-09-22 DIAGNOSIS — H05012 Cellulitis of left orbit: Secondary | ICD-10-CM

## 2023-09-25 ENCOUNTER — Ambulatory Visit: Admission: RE | Admit: 2023-09-25 | Payer: Medicare Other | Source: Ambulatory Visit

## 2024-09-08 ENCOUNTER — Encounter: Payer: Self-pay | Admitting: *Deleted

## 2024-11-03 ENCOUNTER — Telehealth: Payer: Self-pay

## 2024-11-03 NOTE — Telephone Encounter (Signed)
 Copied from CRM [id number]. Topic: Appointments - Transfer of Care >> Nov 03, 2024  1:07 PM Zy'onna H wrote: Pt is requesting to transfer FROM: Salina Clarity, NP Pt is requesting to transfer TO:  1. Aletha Bene, MD 2. Kayla Gauze, FNP 3. Duanne Lowers. MD Reason for requested transfer: Current provider is retiring It is the responsibility of the team the patient would like to transfer to (Dr. Selected) to reach out to the patient if for any reason this transfer is not acceptable.   **ATTN** Patient niece called in requesting this TOC for her and her Uncle. She is currently his POA. She had advised doesn't mind which provider the patient gets, earlier is best.  She has also requested their appt (Herself & Uncle) to be Same Day/Close in Time if applicable.   Patient declined to schedule due to appt's being too far out.
